# Patient Record
Sex: Female | Born: 1995 | Hispanic: Yes | Marital: Married
Health system: Southern US, Community
[De-identification: ages and names within clinical notes are randomized; demographics above are authoritative.]

---

## 2017-05-24 ENCOUNTER — Emergency Department (HOSPITAL_COMMUNITY)
Admission: EM | Admit: 2017-05-24 | Discharge: 2017-05-24 | Disposition: A | Discharge status: Home / Self Care | Payer: Self-pay | Attending: Emergency Medicine | Admitting: Emergency Medicine

## 2017-05-24 ENCOUNTER — Emergency Department (HOSPITAL_COMMUNITY): Payer: Self-pay

## 2017-05-24 ENCOUNTER — Encounter (HOSPITAL_COMMUNITY): Payer: Self-pay | Admitting: Nurse Practitioner

## 2017-05-24 DIAGNOSIS — D649 Anemia, unspecified: Secondary | ICD-10-CM | POA: Insufficient documentation

## 2017-05-24 DIAGNOSIS — R4182 Altered mental status, unspecified: Secondary | ICD-10-CM

## 2017-05-24 DIAGNOSIS — E876 Hypokalemia: Secondary | ICD-10-CM | POA: Insufficient documentation

## 2017-05-24 DIAGNOSIS — F10929 Alcohol use, unspecified with intoxication, unspecified: Secondary | ICD-10-CM | POA: Insufficient documentation

## 2017-05-24 LAB — URINALYSIS, ROUTINE W REFLEX MICROSCOPIC
BILIRUBIN URINE: NEGATIVE
Glucose, UA: NEGATIVE mg/dL
Hgb urine dipstick: NEGATIVE
Ketones, ur: NEGATIVE mg/dL
Nitrite: NEGATIVE
PH: 7 (ref 5.0–8.0)
Protein, ur: NEGATIVE mg/dL
SPECIFIC GRAVITY, URINE: 1.005 (ref 1.005–1.030)

## 2017-05-24 LAB — CBC WITH DIFFERENTIAL/PLATELET
BASOS ABS: 0.1 10*3/uL (ref 0.0–0.1)
Basophils Relative: 1 %
EOS ABS: 0.2 10*3/uL (ref 0.0–0.7)
Eosinophils Relative: 4 %
HCT: 32.9 % — ABNORMAL LOW (ref 36.0–46.0)
HEMOGLOBIN: 10.7 g/dL — AB (ref 12.0–15.0)
LYMPHS ABS: 1 10*3/uL (ref 0.7–4.0)
LYMPHS PCT: 23 %
MCH: 23.6 pg — AB (ref 26.0–34.0)
MCHC: 32.5 g/dL (ref 30.0–36.0)
MCV: 72.5 fL — ABNORMAL LOW (ref 78.0–100.0)
Monocytes Absolute: 0.3 10*3/uL (ref 0.1–1.0)
Monocytes Relative: 7 %
Neutro Abs: 2.9 10*3/uL (ref 1.7–7.7)
Neutrophils Relative %: 65 %
PLATELETS: 256 10*3/uL (ref 150–400)
RBC: 4.54 MIL/uL (ref 3.87–5.11)
RDW: 16.8 % — ABNORMAL HIGH (ref 11.5–15.5)
WBC: 4.5 10*3/uL (ref 4.0–10.5)

## 2017-05-24 LAB — COMPREHENSIVE METABOLIC PANEL
ALK PHOS: 57 U/L (ref 38–126)
ALT: 15 U/L (ref 14–54)
AST: 26 U/L (ref 15–41)
Albumin: 4.3 g/dL (ref 3.5–5.0)
Anion gap: 11 (ref 5–15)
BUN: 18 mg/dL (ref 6–20)
CALCIUM: 8.4 mg/dL — AB (ref 8.9–10.3)
CHLORIDE: 105 mmol/L (ref 101–111)
CO2: 23 mmol/L (ref 22–32)
CREATININE: 0.59 mg/dL (ref 0.44–1.00)
GFR calc non Af Amer: >60 mL/min (ref >60)
GLUCOSE: 100 mg/dL — AB (ref 65–99)
Potassium: 3 mmol/L — ABNORMAL LOW (ref 3.5–5.1)
SODIUM: 139 mmol/L (ref 135–145)
Total Bilirubin: 0.4 mg/dL (ref 0.3–1.2)
Total Protein: 7.5 g/dL (ref 6.5–8.1)

## 2017-05-24 LAB — I-STAT CG4 LACTIC ACID, ED
LACTIC ACID, VENOUS: 1.87 mmol/L (ref 0.5–1.9)
Lactic Acid, Venous: 2.17 mmol/L (ref 0.5–1.9)

## 2017-05-24 LAB — RAPID URINE DRUG SCREEN, HOSP PERFORMED
AMPHETAMINES: NOT DETECTED
BARBITURATES: NOT DETECTED
BENZODIAZEPINES: NOT DETECTED
COCAINE: NOT DETECTED
OPIATES: NOT DETECTED
TETRAHYDROCANNABINOL: NOT DETECTED

## 2017-05-24 LAB — LIPASE, BLOOD: Lipase: 27 U/L (ref 11–51)

## 2017-05-24 LAB — ETHANOL: Alcohol, Ethyl (B): 220 mg/dL — ABNORMAL HIGH (ref <5)

## 2017-05-24 MED ORDER — SODIUM CHLORIDE 0.9 % IV BOLUS (SEPSIS)
1000.0000 mL | Freq: Once | INTRAVENOUS | Status: AC
  Administered 2017-05-24: 1000 mL via INTRAVENOUS

## 2017-05-24 MED ORDER — POTASSIUM CHLORIDE CRYS ER 20 MEQ PO TBCR
40.0000 meq | EXTENDED_RELEASE_TABLET | Freq: Once | ORAL | Status: AC
  Administered 2017-05-24: 40 meq via ORAL
  Filled 2017-05-24: qty 2

## 2017-05-24 MED ORDER — POTASSIUM CHLORIDE 10 MEQ/100ML IV SOLN
10.0000 meq | INTRAVENOUS | Status: AC
  Administered 2017-05-24 (×2): 10 meq via INTRAVENOUS
  Filled 2017-05-24 (×2): qty 100

## 2017-05-24 MED ORDER — SODIUM CHLORIDE 0.9 % IV SOLN
1000.0000 mL | INTRAVENOUS | Status: DC
  Administered 2017-05-24: 1000 mL via INTRAVENOUS

## 2017-05-24 NOTE — ED Provider Notes (Signed)
8:22 AM Pt signed out to me at shift change. Pt with alcohol intoxication signed out pending sober, PO trial, ambulate. I reassessed the patient. Patient is sleeping, wakes up when prompted. Falls right back asleep. Apparently she was able to ambulate to and from the bathroom earlier and was drinking water without emesis. Awaiting family to come pick patient up. Not stable for dc home alone at this time.   9:02 AM Patient's family is at bedside. Patient is now awake, very pleasant, states she feels good. She tolerated water in front of me with no emesis or nausea. Gait steady, no problems ambulating. She would like to be discharged home. VS normal. I will discharge her home with family. Return precautions discussed.   Vitals:   05/24/17 0158 05/24/17 0639 05/24/17 0840  BP: 107/66 107/73 100/70  Pulse: 92 91 (!) 102  Resp: 16 16 17   Temp: 98 F (36.7 C)    TempSrc: Axillary    SpO2: 95% 99% 100%        Jaynie CrumbleKirichenko, Bonnye Halle, PA-C 05/24/17 [id number]    Marily MemosMesner, Jason, MD 05/25/17 [phone]

## 2017-05-24 NOTE — ED Notes (Signed)
I Stat Lactic: 2.17 Notified Dr Preston FleetingGlick and RN

## 2017-05-24 NOTE — ED Notes (Signed)
[facility] Expected date:  Expected time:  Means of arrival:  Comments: EMS 21 yo female intoxicated

## 2017-05-24 NOTE — ED Triage Notes (Signed)
Pt is presented by EMS who report they were called on scene [place] a gas station where pt was found semi responsive. Pt's friends at bedside report that pt was celebrating her birthday last night and may have had "alot of alcohol" drinks. Pt is responsive to verbal stimuli.

## 2017-05-24 NOTE — Discharge Instructions (Signed)
1. Medications: usual home medications 2. Treatment: rest, drink plenty of fluids,  3. Follow Up: Please followup with your primary doctor in 7 days for discussion of your diagnoses and further evaluation after today's visit; if you do not have a primary care doctor use the resource guide provided to find one; Please return to the ER for persistent vomiting, fevers or other concerns

## 2017-05-24 NOTE — ED Notes (Signed)
Contact person for pt pick up post discharge [phone] Pt's Husband

## 2017-05-24 NOTE — ED Notes (Signed)
She is sleeping comfortably. Her skin is normal, warm and dry and she is breathing normally. It was reported by the night nurse that pt. Has been ambulatory without difficulty and that she has been able to drink liquids without issue.

## 2017-05-24 NOTE — ED Provider Notes (Signed)
[facility]-EMERGENCY DEPT Provider Note   CSN: [id number] Arrival date & time: 05/24/17  0158     History   Chief Complaint Chief Complaint  Patient presents with  . Alcohol Intoxication    HPI Janice Waters is a 21 y.o. female who presents to the emergency room with altered mental status.  Level V caveat for altered mental status  Patient arrives via EMS who reports they found her slumped over and a gas station parking lot. Patient's friends who were with her on scene reported that night was her birthday and she was drinking more than usual. They denied drug use.  Unknown medical problems.  No friends or at bedside for me to discuss the situation.  The history is provided by the EMS personnel and medical records. No language interpreter was used.    History reviewed. No pertinent past medical history.  There are no active problems to display for this patient.   History reviewed. No pertinent surgical history.  OB History    No data available       Home Medications    Prior to Admission medications   Not on File    Family History History reviewed. No pertinent family history.  Social History Social History  Substance Use Topics  . Smoking status: Not on file  . Smokeless tobacco: Not on file  . Alcohol use Yes     Allergies   Patient has no known allergies.   Review of Systems Review of Systems  Unable to perform ROS: Mental status change     Physical Exam Updated Vital Signs BP 107/66 (BP Location: Right Arm)   Pulse 92   Temp 98 F (36.7 C) (Axillary)   Resp 16   LMP  (LMP Unknown)   SpO2 95%   Physical Exam  Constitutional: She appears well-developed and well-nourished. She appears lethargic. No distress.  HENT:  Head: Normocephalic.  No obvious signs of head injury  Eyes: Conjunctivae are normal. Pupils are equal, round, and reactive to light. No scleral icterus.  Neck: Normal range of motion.  Cardiovascular: Normal rate.     Pulses:      Carotid pulses are 2+ on the right side, and 2+ on the left side.      Radial pulses are 1+ on the right side, and 1+ on the left side.  Pulmonary/Chest: Effort normal and breath sounds normal.  Abdominal: Soft.  Musculoskeletal: She exhibits no edema.  Neurological: She appears lethargic. GCS eye subscore is 1. GCS verbal subscore is 2. GCS motor subscore is 4.  Skin: Skin is warm and dry. No rash noted.  Nursing note and vitals reviewed.    ED Treatments / Results  Labs (all labs ordered are listed, but only abnormal results are displayed) Labs Reviewed  CBC WITH DIFFERENTIAL/PLATELET - Abnormal; Notable for the following:       Result Value   Hemoglobin 10.7 (*)    HCT 32.9 (*)    MCV 72.5 (*)    MCH 23.6 (*)    RDW 16.8 (*)    All other components within normal limits  COMPREHENSIVE METABOLIC PANEL - Abnormal; Notable for the following:    Potassium 3.0 (*)    Glucose, Bld 100 (*)    Calcium 8.4 (*)    All other components within normal limits  ETHANOL - Abnormal; Notable for the following:    Alcohol, Ethyl (B) 220 (*)    All other components within normal limits  I-STAT CG4  LACTIC ACID, ED - Abnormal; Notable for the following:    Lactic Acid, Venous 2.17 (*)    All other components within normal limits  LIPASE, BLOOD  RAPID URINE DRUG SCREEN, HOSP PERFORMED  URINALYSIS, ROUTINE W REFLEX MICROSCOPIC  I-STAT CG4 LACTIC ACID, ED     Radiology Ct Head Wo Contrast  Result Date: 05/24/2017 CLINICAL DATA:  21 y/o  F; found semi responsive. EXAM: CT HEAD WITHOUT CONTRAST TECHNIQUE: Contiguous axial images were obtained from the base of the skull through the vertex without intravenous contrast. COMPARISON:  None. FINDINGS: Brain: No evidence of acute infarction, hemorrhage, hydrocephalus, extra-axial collection or mass lesion/mass effect. Vascular: No hyperdense vessel or unexpected calcification. Skull: Normal. Negative for fracture or focal lesion.  Sinuses/Orbits: No acute finding. Other: None. IMPRESSION: No acute intracranial abnormality.  Unremarkable CT of the head. Electronically Signed   By: Mitzi Hansen M.D.   On: 05/24/2017 03:48    Procedures Procedures (including critical care time)  Medications Ordered in ED Medications  sodium chloride 0.9 % bolus 1,000 mL (0 mLs Intravenous Stopped 05/24/17 0420)    Followed by  sodium chloride 0.9 % bolus 1,000 mL (0 mLs Intravenous Stopped 05/24/17 0410)    Followed by  0.9 %  sodium chloride infusion (1,000 mLs Intravenous New Bag/Given 05/24/17 0446)  potassium chloride 10 mEq in 100 mL IVPB (10 mEq Intravenous New Bag/Given 05/24/17 0543)  potassium chloride SA (K-DUR,KLOR-CON) CR tablet 40 mEq (40 mEq Oral Given 05/24/17 0449)     Initial Impression / Assessment and Plan / ED Course  I have reviewed the triage vital signs and the nursing notes.  Pertinent labs & imaging results that were available during my care of the patient were reviewed by me and considered in my medical decision making (see chart for details).  Clinical Course as of May 25 611  Sat May 24, 2017  0522 Pt continues to sleep.  She will need to sober, eat and ambulate before discharge home.    [HM]    Clinical Course User Index [HM] Marcine Gadway, [place], [place]    Patient presents with significant alcohol intoxication and altered mental status. She is alone, unknown medical history.  5:23 AM Labs are reassuring. Lactic acid is elevated. Patient is receiving fluids. Patient also with hypokalemia. IV repletion begun. Oral repletion ordered for when patient is alert enough to swallow. Ethanol level is 220. Mild anemia is noted.  6:12 AM At shift change care was transferred to [facility], PA-C who will follow pending studies, re-evaulate and determine disposition.     Final Clinical Impressions(s) / ED Diagnoses   Final diagnoses:  Alcoholic intoxication with complication (HCC)  Hypokalemia    Anemia, unspecified type  Altered mental status, unspecified altered mental status type    New Prescriptions New Prescriptions   No medications on file     Janice Waters, Janice Waters 05/24/17 0612    Dione Booze, MD 05/24/17 [phone]

## 2017-05-24 NOTE — ED Notes (Addendum)
Fluid challenge done. Tolerated well. 

## 2018-04-15 IMAGING — CT CT HEAD W/O CM
3 of 4 series · 15 of 47 positions shown, 18 images · non-contrast
Comparison: None.

CLINICAL DATA: 21 y/o  F; found semi responsive.

EXAM:
CT HEAD WITHOUT CONTRAST
TECHNIQUE: Contiguous axial images were obtained from the base of the skull
through the vertex without intravenous contrast.

[Series 2: head w/o · axial · non-contrast · 0.45mm/px · z∈[-149,-29]mm · 9 of 29 slices shown, 12 images]
[im 3/29  brain]
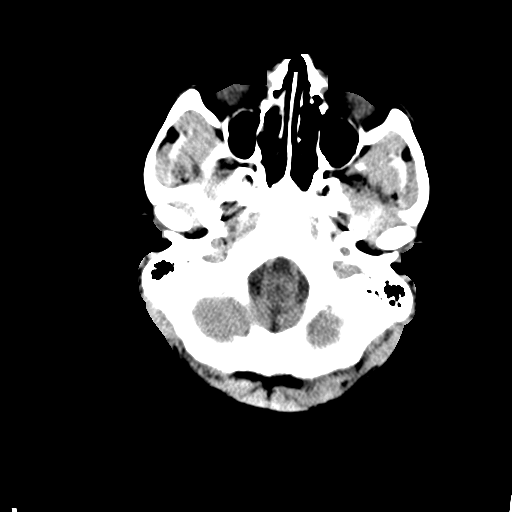
[im 3/29  bone]
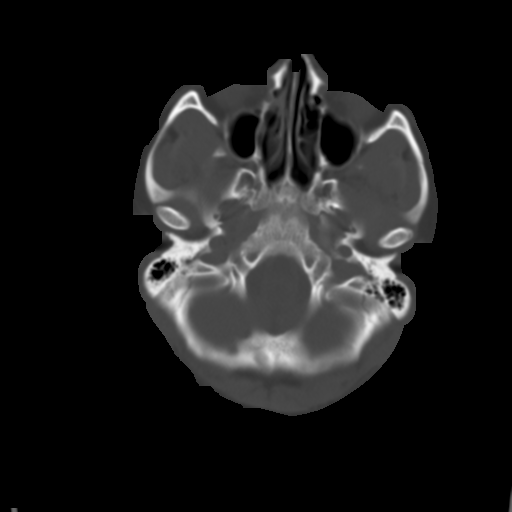
[im 7/29  brain]
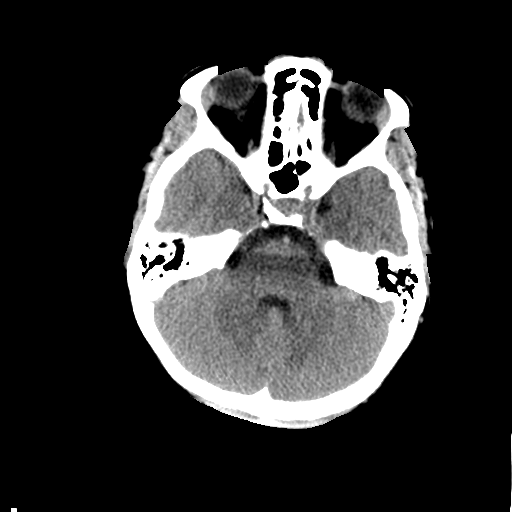
[im 9/29  brain]
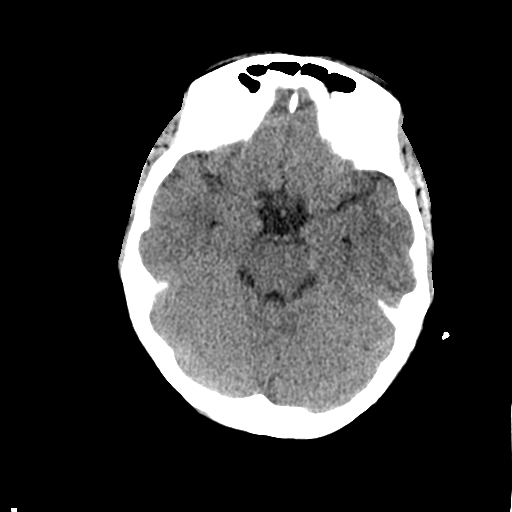
[im 13/29  brain]
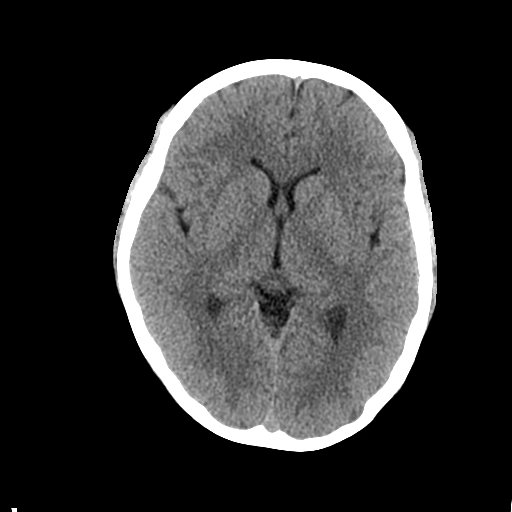
[im 15/29  brain]
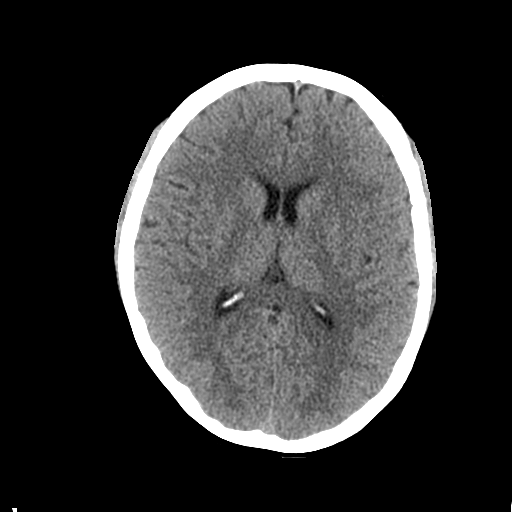
[im 15/29  bone]
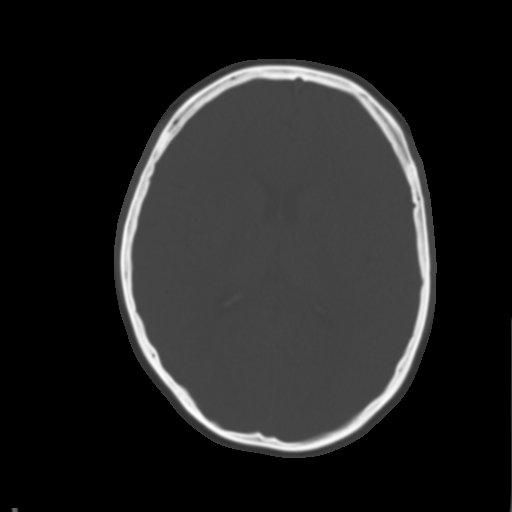
[im 17/29  brain]
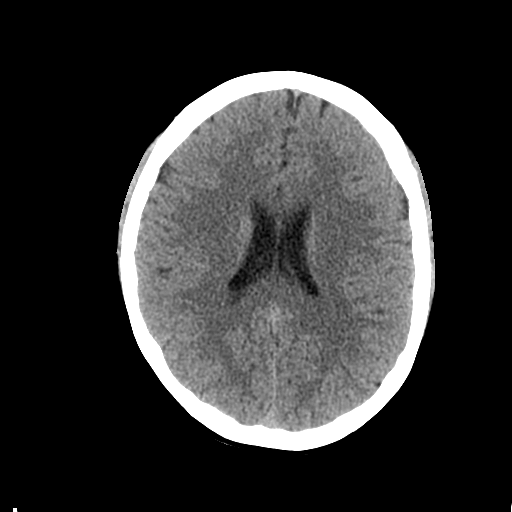
[im 21/29  brain]
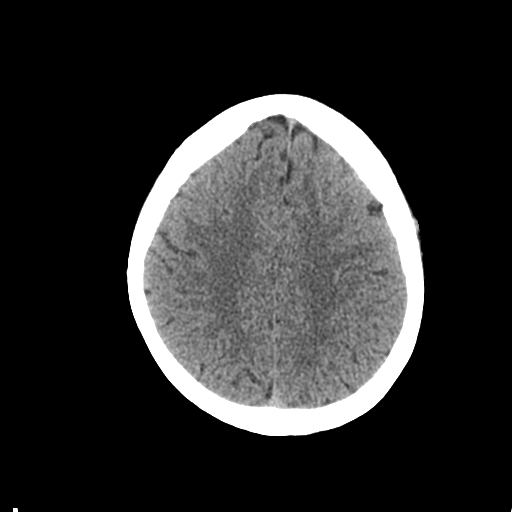
[im 23/29  brain]
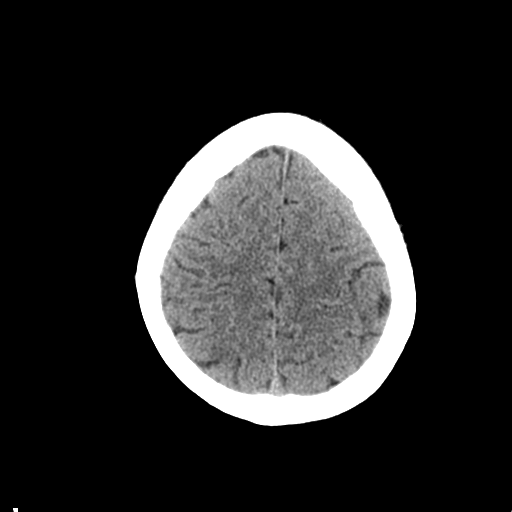
[im 27/29  brain]
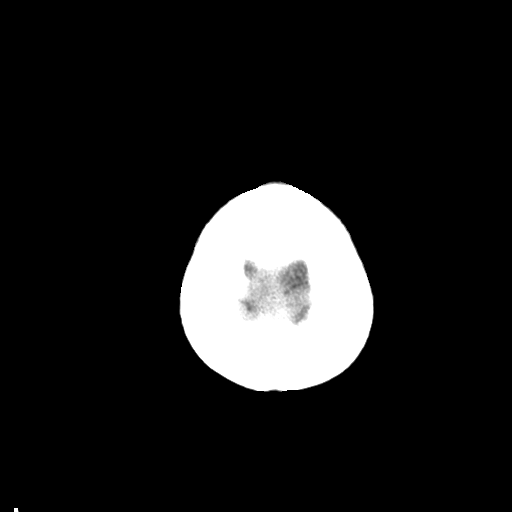
[im 27/29  bone]
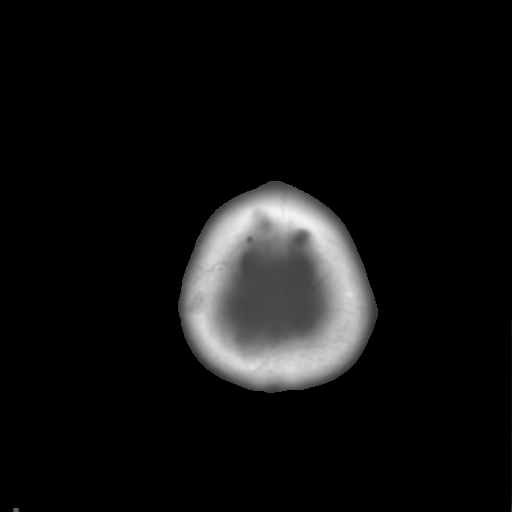

[Series 5: coronal · coronal · 0.29mm/px · 3 of 60 slices shown]
[im 20/60  brain]
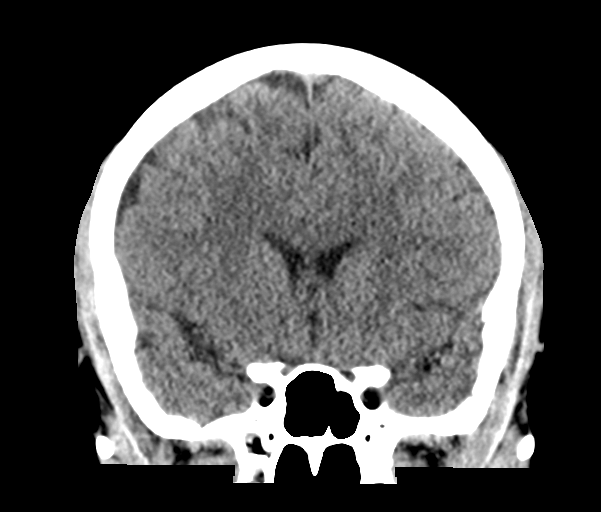
[im 27/60  brain]
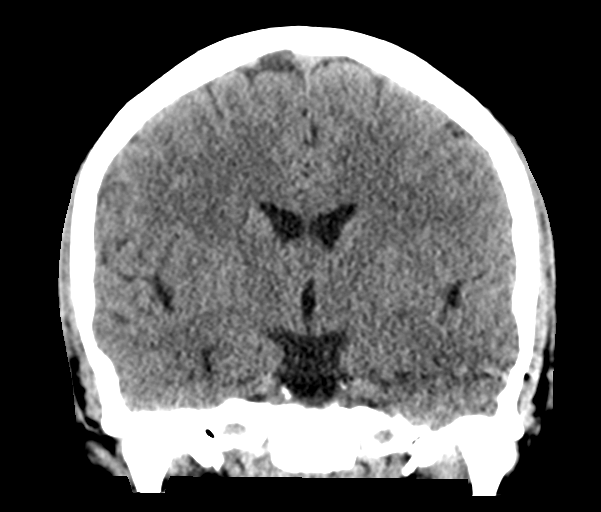
[im 33/60  brain]
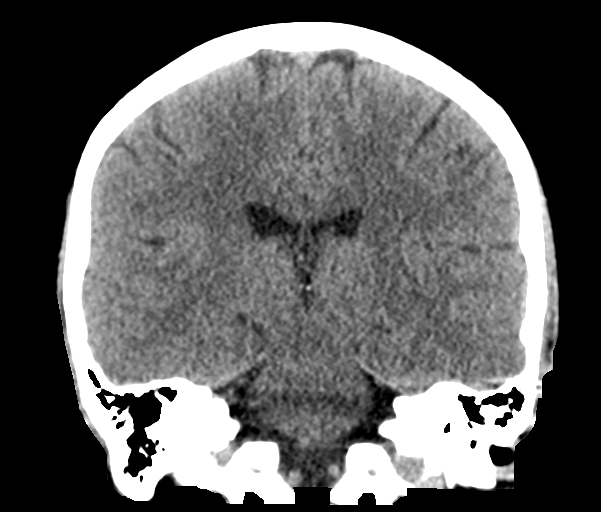

[Series 6: sagittal · sagittal · 0.29mm/px · 3 of 50 slices shown]
[im 17/50  brain]
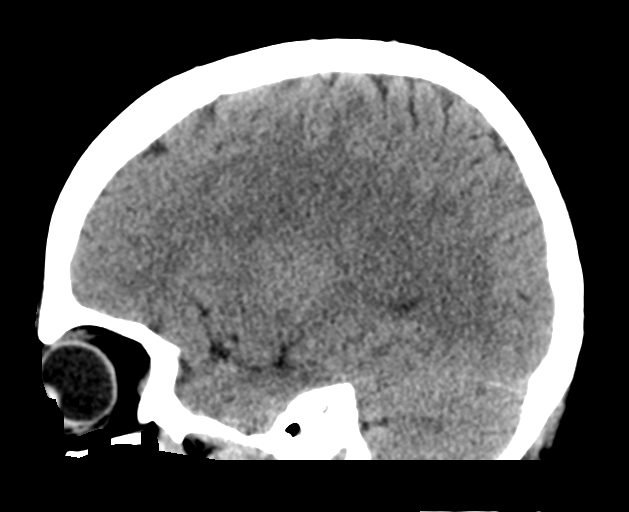
[im 25/50  brain]
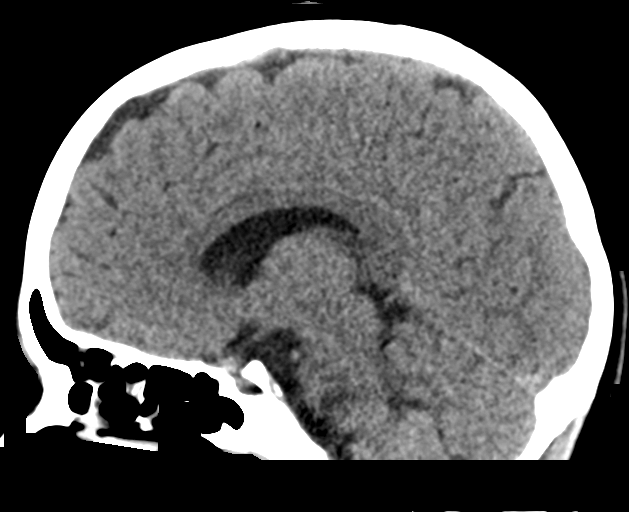
[im 33/50  brain]
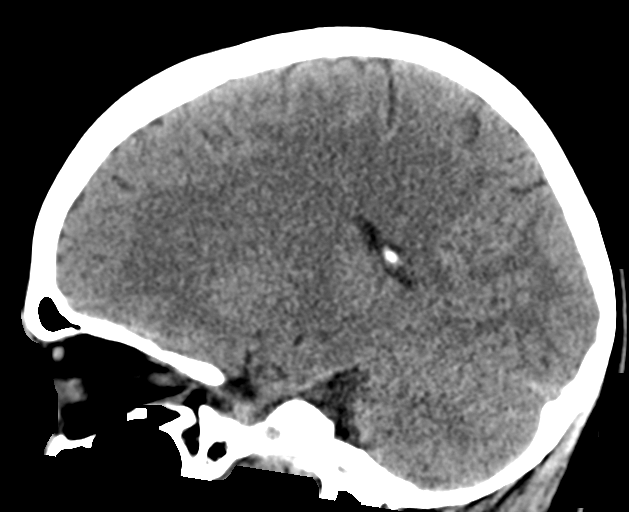

[15 of 47 positions shown; findings below may reference images not displayed]

FINDINGS: Brain: No evidence of acute infarction, hemorrhage, hydrocephalus,
extra-axial collection or mass lesion/mass effect.

Vascular: No hyperdense vessel or unexpected calcification.

Skull: Normal. Negative for fracture or focal lesion.

Sinuses/Orbits: No acute finding.

Other: None.
IMPRESSION: No acute intracranial abnormality.  Unremarkable CT of the head.

By: Endorfina Sport Heinrich M.D.

## 2020-12-04 ENCOUNTER — Other Ambulatory Visit: Payer: Self-pay
